# Patient Record
Sex: Female | Born: 1985 | Race: Black or African American | Hispanic: No | Marital: Single | Smoking: Never smoker
Health system: Southern US, Community
[De-identification: ages and names within clinical notes are randomized; demographics above are authoritative.]

## PROBLEM LIST (undated history)

## (undated) DIAGNOSIS — F419 Anxiety disorder, unspecified: Secondary | ICD-10-CM

---
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
## 2005-04-11 ENCOUNTER — Other Ambulatory Visit: Admission: RE | Admit: 2005-04-11 | Discharge: 2005-04-11 | Payer: Self-pay | Admitting: Gynecology

## 2005-05-21 ENCOUNTER — Emergency Department (HOSPITAL_COMMUNITY): Admission: EM | Admit: 2005-05-21 | Discharge: 2005-05-22 | Payer: Self-pay | Admitting: Emergency Medicine

## 2005-09-25 ENCOUNTER — Inpatient Hospital Stay (HOSPITAL_COMMUNITY): Admission: AD | Admit: 2005-09-25 | Discharge: 2005-09-25 | Payer: Self-pay | Admitting: Gynecology

## 2005-12-03 ENCOUNTER — Encounter (INDEPENDENT_AMBULATORY_CARE_PROVIDER_SITE_OTHER): Payer: Self-pay | Admitting: Specialist

## 2005-12-03 ENCOUNTER — Inpatient Hospital Stay (HOSPITAL_COMMUNITY): Admission: AD | Admit: 2005-12-03 | Discharge: 2005-12-05 | Payer: Self-pay | Admitting: Gynecology

## 2006-01-09 ENCOUNTER — Other Ambulatory Visit: Admission: RE | Admit: 2006-01-09 | Discharge: 2006-01-09 | Payer: Self-pay | Admitting: Gynecology

## 2008-04-10 ENCOUNTER — Encounter: Payer: Self-pay | Admitting: Women's Health

## 2008-04-10 ENCOUNTER — Ambulatory Visit: Payer: Self-pay | Admitting: Women's Health

## 2008-04-10 ENCOUNTER — Other Ambulatory Visit: Admission: RE | Admit: 2008-04-10 | Discharge: 2008-04-10 | Payer: Self-pay | Admitting: Gynecology

## 2008-04-10 ENCOUNTER — Ambulatory Visit: Payer: Self-pay | Admitting: Gynecology

## 2009-10-01 ENCOUNTER — Ambulatory Visit: Payer: Self-pay | Admitting: Diagnostic Radiology

## 2009-10-01 ENCOUNTER — Emergency Department (HOSPITAL_BASED_OUTPATIENT_CLINIC_OR_DEPARTMENT_OTHER): Admission: EM | Admit: 2009-10-01 | Discharge: 2009-10-01 | Payer: Self-pay | Admitting: Emergency Medicine

## 2010-04-08 LAB — URINALYSIS, ROUTINE W REFLEX MICROSCOPIC
Glucose, UA: NEGATIVE mg/dL
Ketones, ur: NEGATIVE mg/dL
Nitrite: NEGATIVE
Protein, ur: NEGATIVE mg/dL
Specific Gravity, Urine: 1.013 (ref 1.005–1.030)
pH: 6 (ref 5.0–8.0)

## 2010-04-08 LAB — URINE MICROSCOPIC-ADD ON

## 2010-08-30 ENCOUNTER — Encounter: Payer: Self-pay | Admitting: *Deleted

## 2010-08-30 ENCOUNTER — Other Ambulatory Visit: Payer: Self-pay

## 2010-08-30 ENCOUNTER — Emergency Department (HOSPITAL_BASED_OUTPATIENT_CLINIC_OR_DEPARTMENT_OTHER)
Admission: EM | Admit: 2010-08-30 | Discharge: 2010-08-31 | Disposition: A | Discharge status: Home / Self Care | Payer: Self-pay | Attending: Emergency Medicine | Admitting: Emergency Medicine

## 2010-08-30 ENCOUNTER — Emergency Department (INDEPENDENT_AMBULATORY_CARE_PROVIDER_SITE_OTHER): Payer: Self-pay

## 2010-08-30 DIAGNOSIS — R0602 Shortness of breath: Secondary | ICD-10-CM

## 2010-08-30 DIAGNOSIS — R079 Chest pain, unspecified: Secondary | ICD-10-CM | POA: Insufficient documentation

## 2010-08-30 DIAGNOSIS — R509 Fever, unspecified: Secondary | ICD-10-CM | POA: Insufficient documentation

## 2010-08-30 DIAGNOSIS — A599 Trichomoniasis, unspecified: Secondary | ICD-10-CM | POA: Insufficient documentation

## 2010-08-30 DIAGNOSIS — O99891 Other specified diseases and conditions complicating pregnancy: Secondary | ICD-10-CM

## 2010-08-30 DIAGNOSIS — J90 Pleural effusion, not elsewhere classified: Secondary | ICD-10-CM

## 2010-08-30 DIAGNOSIS — O98819 Other maternal infectious and parasitic diseases complicating pregnancy, unspecified trimester: Secondary | ICD-10-CM | POA: Insufficient documentation

## 2010-08-30 DIAGNOSIS — N390 Urinary tract infection, site not specified: Secondary | ICD-10-CM | POA: Insufficient documentation

## 2010-08-30 HISTORY — DX: Anxiety disorder, unspecified: F41.9

## 2010-08-30 LAB — CBC
Platelets: 202 10*3/uL (ref 150–400)
RBC: 3.95 MIL/uL (ref 3.87–5.11)
RDW: 14.8 % (ref 11.5–15.5)

## 2010-08-30 LAB — URINE MICROSCOPIC-ADD ON

## 2010-08-30 LAB — BASIC METABOLIC PANEL
BUN: 5 mg/dL — ABNORMAL LOW (ref 6–23)
CO2: 19 mEq/L (ref 19–32)
Calcium: 8.7 mg/dL (ref 8.4–10.5)
Creatinine, Ser: 0.6 mg/dL (ref 0.50–1.10)
GFR calc Af Amer: >60 mL/min (ref >60)
GFR calc non Af Amer: >60 mL/min (ref >60)
Glucose, Bld: 95 mg/dL (ref 70–99)
Sodium: 128 mEq/L — ABNORMAL LOW (ref 135–145)

## 2010-08-30 LAB — URINALYSIS, ROUTINE W REFLEX MICROSCOPIC
Glucose, UA: 100 mg/dL — AB
Ketones, ur: 40 mg/dL — AB
Nitrite: NEGATIVE
Specific Gravity, Urine: 1.015 (ref 1.005–1.030)
Urobilinogen, UA: 1 mg/dL (ref 0.0–1.0)
pH: 6.5 (ref 5.0–8.0)

## 2010-08-30 MED ORDER — IOHEXOL 350 MG/ML SOLN
80.0000 mL | Freq: Once | INTRAVENOUS | Status: AC | PRN
  Administered 2010-08-30: 80 mL via INTRAVENOUS

## 2010-08-30 MED ORDER — SODIUM CHLORIDE 0.9 % IV BOLUS (SEPSIS)
1000.0000 mL | Freq: Once | INTRAVENOUS | Status: AC
  Administered 2010-08-30: 1000 mL via INTRAVENOUS

## 2010-08-30 MED ORDER — ACETAMINOPHEN 500 MG PO TABS
1000.0000 mg | ORAL_TABLET | Freq: Once | ORAL | Status: AC
  Administered 2010-08-30: 1000 mg via ORAL
  Filled 2010-08-30: qty 2

## 2010-08-30 MED ORDER — POTASSIUM CHLORIDE CRYS ER 20 MEQ PO TBCR
EXTENDED_RELEASE_TABLET | ORAL | Status: AC
  Filled 2010-08-30: qty 3

## 2010-08-30 MED ORDER — MORPHINE SULFATE 2 MG/ML IJ SOLN
2.0000 mg | Freq: Once | INTRAMUSCULAR | Status: AC
  Administered 2010-08-30: 2 mg via INTRAVENOUS
  Filled 2010-08-30: qty 1

## 2010-08-30 MED ORDER — POTASSIUM CHLORIDE 20 MEQ PO PACK
60.0000 meq | PACK | Freq: Once | ORAL | Status: AC
  Administered 2010-08-30: 60 meq via ORAL
  Filled 2010-08-30: qty 3

## 2010-08-30 NOTE — ED Notes (Signed)
MD performed bedside ultrasound, pt tolerated well.

## 2010-08-30 NOTE — ED Notes (Signed)
Pt states she began having a cough and diff breathing on Friday which has worsened. Pt also started having a fever today and has SOB with exertion. Pt states that she is [redacted] weeks pregnant but has had no prenatal care. Pt is G3P2, with 2 healthy deliveries in the past. Denies any abd pain or vaginal bleeding.

## 2010-08-30 NOTE — ED Provider Notes (Addendum)
History     CSN: [id number] Arrival date & time: 08/30/2010  7:23 PM  Chief Complaint  Patient presents with  . Chest Pain   HPI Comments: 25yoF G3P2 [redacted] weeks pregnant ho anxiety pw cp/sob. Per patient sx began today. C/O substernal sharp pain, worse with exertion and with movement. +Fever to 101. +min non productive cough. Denies feelings of anxiety. Feels more SOB than usual. Denies abdominal pain/N/V. Denies hematuria/dysuria/frequency/urgency, no back pain. Denies vaginal discharge. Denies sick contacts. Has not received prenatal care 2/2 no insurance. Has been taking PNV. No h/o VTE in self or family. No h/o ca/hiv/iv drug abuse. Denies recent hosp/surg/travel/immobilization. Baseline leg swelling. States her b/l calf are crampy when she walks, this is not new She can feel the baby moving. There is no gush of fluid or vaginal bleeding.    PAST MEDICAL HISTORY:  Past Medical History  Diagnosis Date  . Anxiety     PAST SURGICAL HISTORY:  History reviewed. No pertinent past surgical history.  MEDICATIONS:  Previous Medications   CHOLECALCIFEROL (VITAMIN D3) 3000 UNITS TABS    Take 1 tablet by mouth daily.     CITALOPRAM (CELEXA) 20 MG TABLET    Take 20 mg by mouth daily.     PRENAT W/O A-FE-DSS-METHFOL-FA (PRENATAL MULTIVITAMIN) 90-600-400 MG-MCG-MCG TABLET    Take 1 tablet by mouth daily.       ALLERGIES:  Allergies as of 08/30/2010  . (No Known Allergies)     FAMILY HISTORY:  No family history on file.   SOCIAL HISTORY: History   Social History  . Marital Status: Single    Spouse Name: N/A    Number of Children: N/A  . Years of Education: N/A   Social History Main Topics  . Smoking status: Never Smoker   . Smokeless tobacco: None  . Alcohol Use: No  . Drug Use: No  . Sexually Active:    Other Topics Concern  . None   Social History Narrative  . None     Review of Systems  Constitutional: Negative.  Negative for fever and fatigue.  HENT: Negative.     Eyes: Negative.   Respiratory: Negative.  Negative for chest tightness and shortness of breath.   Cardiovascular: Negative.  Negative for chest pain and leg swelling.  Gastrointestinal: Negative.  Negative for nausea, vomiting, abdominal pain and blood in stool.  Genitourinary: Negative.   Musculoskeletal: Negative.  Negative for arthralgias.  Neurological: Negative.  Negative for dizziness and light-headedness.  Hematological: Negative.   Psychiatric/Behavioral: Negative.   All other systems reviewed and are negative.  except as noted HPI 10 Systems reviewed and are negative for acute change except as noted in the HPI.  Physical Exam  BP 94/57  Pulse 98  Temp(Src) 98.2 F (36.8 C) (Oral)  Resp 20  SpO2 99%  LMP 03/13/2010  Physical Exam  Nursing note and vitals reviewed. Constitutional: She is oriented to person, place, and time. She appears well-developed.  HENT:  Head: Atraumatic.  Mouth/Throat: Oropharynx is clear and moist.  Eyes: Conjunctivae and EOM are normal. Pupils are equal, round, and reactive to light.  Neck: Normal range of motion. Neck supple.  Cardiovascular: Regular rhythm, normal heart sounds and intact distal pulses.        tachycardic  Pulmonary/Chest: Effort normal. No respiratory distress. She has no wheezes. She has no rales. She exhibits no tenderness.       Bibasilar crackles  Abdominal: Soft. She exhibits distension. There is  no tenderness. There is no rebound and no guarding.       Gravid no ttp Bedside u/s fetus moving +FHR 146 No CVAT  Musculoskeletal: Normal range of motion. She exhibits no edema and no tenderness.  Neurological: She is alert and oriented to person, place, and time.  Skin: Skin is warm and dry. No rash noted.  Psychiatric: She has a normal mood and affect.    ED Course  Procedures  OTHER DATA REVIEWED: Nursing notes, vital signs, and past medical records reviewed.   DIAGNOSTIC STUDIES:    LABS /  RADIOLOGY: Results for orders placed during the hospital encounter of 08/30/10  CBC      Component Value Range   WBC 4.8  4.0 - 10.5 (K/uL)   RBC 3.95  3.87 - 5.11 (MIL/uL)   Hemoglobin 10.3 (*) 12.0 - 15.0 (g/dL)   HCT 45.4 (*) 09.8 - 46.0 (%)   MCV 78.0  78.0 - 100.0 (fL)   MCH 26.1  26.0 - 34.0 (pg)   MCHC 33.4  30.0 - 36.0 (g/dL)   RDW 11.9  14.7 - 82.9 (%)   Platelets 202  150 - 400 (K/uL)  BASIC METABOLIC PANEL      Component Value Range   Sodium 128 (*) 135 - 145 (mEq/L)   Potassium 2.7 (*) 3.5 - 5.1 (mEq/L)   Chloride 96  96 - 112 (mEq/L)   CO2 19  19 - 32 (mEq/L)   Glucose, Bld 95  70 - 99 (mg/dL)   BUN 5 (*) 6 - 23 (mg/dL)   Creatinine, Ser 5.62  0.50 - 1.10 (mg/dL)   Calcium 8.7  8.4 - 13.0 (mg/dL)   GFR calc non Af Amer >60  >60 (mL/min)   GFR calc Af Amer >60  >60 (mL/min)  URINALYSIS, ROUTINE W REFLEX MICROSCOPIC      Component Value Range   Color, Urine YELLOW  YELLOW    Appearance CLOUDY (*) CLEAR    Specific Gravity, Urine 1.015  1.005 - 1.030    pH 6.5  5.0 - 8.0    Glucose, UA 100 (*) NEGATIVE (mg/dL)   Hgb urine dipstick SMALL (*) NEGATIVE    Bilirubin Urine NEGATIVE  NEGATIVE    Ketones, ur 40 (*) NEGATIVE (mg/dL)   Protein, ur 865 (*) NEGATIVE (mg/dL)   Urobilinogen, UA 1.0  0.0 - 1.0 (mg/dL)   Nitrite NEGATIVE  NEGATIVE    Leukocytes, UA LARGE (*) NEGATIVE   URINE MICROSCOPIC-ADD ON      Component Value Range   Squamous Epithelial / LPF FEW (*) RARE    WBC, UA TOO NUMEROUS TO COUNT  <3 (WBC/hpf)   RBC / HPF 7-10  <3 (RBC/hpf)   Bacteria, UA MANY (*) RARE    Urine-Other MUCOUS PRESENT     Ct Angio Chest W/cm &/or Wo Cm  08/30/2010  *RADIOLOGY REPORT*  Clinical Data:  Chest pain, shortness of breath, [redacted] weeks pregnant, abdomen shielded  CT ANGIOGRAPHY CHEST WITH CONTRAST  Technique:  Multidetector CT imaging of the chest was performed using the standard protocol during bolus administration of intravenous contrast.  Multiplanar CT image  reconstructions including MIPs were obtained to evaluate the vascular anatomy.  Contrast:  80 ml Omnipaque-300 IV  Comparison:  Chest radiograph dated 10/01/2009  Findings:  Suboptimal contrast bolus with the majority of contrast in the aorta and right heart at the time of imaging.  No findings suspicious for pulmonary embolism.  Small bilateral pleural effusions.  Associated bilateral lower lobe opacities, likely atelectasis.  No suspicious pulmonary nodules. No pneumothorax.  Visualized thyroid is unremarkable.  Triangular soft tissue in the anterior mediastinum, likely reflecting residual thymus (series 4/image 22).  The heart is top normal in size.  No pericardial effusion.  No suspicious mediastinal, hilar, or axillary lymphadenopathy.  Visualized upper abdomen is unremarkable.  Review of the MIP images confirms the above findings.  IMPRESSION: Suboptimal contrast bolus.  No findings suspicious for pulmonary embolism.  Small bilateral pleural effusions.  Associated bilateral lower lobe opacities, likely atelectasis.  Original Report Authenticated By: Charline Bills, M.D.   PROCEDURES:   Date: 08/31/2010  Rate: 125  Rhythm: normal sinus rhythm  QRS Axis: normal  Intervals: normal  ST/T Wave abnormalities: nonspecific T wave changes  Conduction Disutrbances:none  Narrative Interpretation:   Old EKG Reviewed: changes noted    ED COURSE / COORDINATION OF CARE: 12:44 AM  Pt feeling better. No CP at this time. Tachycardia resolved now afebrile Will treat UTI and trich noted on urine although pt is asx. No pneumonia or PE on study. Will dc home with f//u   MDM: Differential Diagnosis: 1. CP- pna, PE, msk pain pericarditis myocarditis less likely   PLAN: Discharge The patient is to return the emergency department if there is any worsening of symptoms. I have reviewed the discharge instructions with the patient/family   CONDITION ON DISCHARGE: Stable, improved   MEDICATIONS GIVEN IN  THE E.D.  Medications  Prenat w/o A-FE-DSS-Methfol-FA (PRENATAL MULTIVITAMIN) 90-600-400 MG-MCG-MCG tablet (not administered)  citalopram (CELEXA) 20 MG tablet (not administered)  Cholecalciferol (VITAMIN D3) 3000 UNITS TABS (not administered)  nitrofurantoin, macrocrystal-monohydrate, (MACROBID) 100 MG capsule (not administered)  potassium chloride (K-DUR) 10 MEQ tablet (not administered)  potassium chloride (K-DUR) 10 MEQ tablet (not administered)  nitrofurantoin, macrocrystal-monohydrate, (MACROBID) 100 MG capsule (not administered)  sodium chloride 0.9 % bolus 1,000 mL (1000 mL Intravenous Given 08/30/10 2000)  acetaminophen (TYLENOL) tablet 1,000 mg (1000 mg Oral Given 08/30/10 2030)  iohexol (OMNIPAQUE) 350 MG/ML injection 80 mL (80 mL Intravenous Contrast Given 08/30/10 2139)  potassium chloride (KLOR-CON) packet 60 mEq (60 mEq Oral Given 08/30/10 2145)  morphine injection 2 mg (2 mg Intravenous Given 08/30/10 2230)  sodium chloride 0.9 % bolus 1,000 mL (1000 mL Intravenous Total Dose 08/31/10 0017)  potassium chloride SA (K-DUR,KLOR-CON) 20 MEQ CR tablet (0 mEq  Duplicate 08/30/10 2230)      Forbes Cellar, MD 08/31/10 4098  Forbes Cellar, MD 08/31/10 [phone]

## 2010-08-30 NOTE — ED Notes (Signed)
Pt ambulated to restroom without diff, NAD noted.

## 2010-08-30 NOTE — ED Notes (Signed)
Chest pain. Fever 101 at home today. Cough. 21 weeks preg.

## 2010-08-30 NOTE — ED Notes (Signed)
rec'd call from lab reporting critical K+ of 2.7. MD made aware.

## 2010-08-31 MED ORDER — METRONIDAZOLE 500 MG PO TABS: 500.0000 mg | ORAL_TABLET | Freq: Two times a day (BID) | ORAL | Status: DC

## 2010-08-31 MED ORDER — NITROFURANTOIN MONOHYD MACRO 100 MG PO CAPS: 100.0000 mg | ORAL_CAPSULE | Freq: Two times a day (BID) | ORAL | Status: DC

## 2010-08-31 MED ORDER — METRONIDAZOLE 500 MG PO TABS: 500.0000 mg | ORAL_TABLET | Freq: Two times a day (BID) | ORAL | Status: AC

## 2010-08-31 MED ORDER — POTASSIUM CHLORIDE ER 10 MEQ PO TBCR: 20.0000 meq | EXTENDED_RELEASE_TABLET | Freq: Two times a day (BID) | ORAL | Status: DC

## 2010-08-31 MED ORDER — NITROFURANTOIN MONOHYD MACRO 100 MG PO CAPS: 100.0000 mg | ORAL_CAPSULE | Freq: Two times a day (BID) | ORAL | Status: AC

## 2010-09-01 ENCOUNTER — Other Ambulatory Visit: Payer: Self-pay

## 2010-09-01 ENCOUNTER — Encounter (HOSPITAL_BASED_OUTPATIENT_CLINIC_OR_DEPARTMENT_OTHER): Payer: Self-pay | Admitting: *Deleted

## 2010-09-01 ENCOUNTER — Emergency Department (HOSPITAL_BASED_OUTPATIENT_CLINIC_OR_DEPARTMENT_OTHER)
Admission: EM | Admit: 2010-09-01 | Discharge: 2010-09-01 | Disposition: A | Discharge status: Home / Self Care | Payer: Self-pay | Attending: Emergency Medicine | Admitting: Emergency Medicine

## 2010-09-01 DIAGNOSIS — R079 Chest pain, unspecified: Secondary | ICD-10-CM | POA: Insufficient documentation

## 2010-09-01 LAB — BASIC METABOLIC PANEL
Calcium: 8.6 mg/dL (ref 8.4–10.5)
Chloride: 99 mEq/L (ref 96–112)
Creatinine, Ser: 0.5 mg/dL (ref 0.50–1.10)
Sodium: 132 mEq/L — ABNORMAL LOW (ref 135–145)

## 2010-09-01 LAB — CBC
HCT: 30.4 % — ABNORMAL LOW (ref 36.0–46.0)
MCH: 26 pg (ref 26.0–34.0)
MCHC: 33.2 g/dL (ref 30.0–36.0)
RDW: 14.9 % (ref 11.5–15.5)

## 2010-09-01 MED ORDER — HYDROCODONE-ACETAMINOPHEN 5-325 MG PO TABS: 1.0000 | ORAL_TABLET | ORAL | Status: AC | PRN

## 2010-09-01 MED ORDER — SODIUM CHLORIDE 0.9 % IV BOLUS (SEPSIS): 250.0000 mL | Freq: Once | INTRAVENOUS | Status: DC

## 2010-09-01 MED ORDER — SODIUM CHLORIDE 0.9 % IV SOLN: INTRAVENOUS | Status: DC

## 2010-09-01 NOTE — ED Notes (Signed)
Patient is resting comfortably.Denies any chest pain or SOB at this time

## 2010-09-01 NOTE — ED Notes (Signed)
Patient is resting comfortably. 

## 2010-09-01 NOTE — ED Notes (Signed)
Vital signs stable. 

## 2010-09-01 NOTE — ED Notes (Signed)
Pt lying in bed CAO watching TV. Pt is in NAD with resp even and non-labored. Pt denies pain now but sts some earlier when rolling onto her side. Pt showing NSR on monitor with rate of 79. IV site unremarkable with NS infusing.

## 2010-09-01 NOTE — ED Notes (Signed)
Pt states seen here Friday for same, return today with c/o palpitations and cont CP.

## 2010-11-30 NOTE — ED Provider Notes (Signed)
History     CSN: [id number] Arrival date & time: 09/01/2010  5:02 PM   None     Chief Complaint  Patient presents with  . Chest Pain    (Consider location/radiation/quality/duration/timing/severity/associated sxs/prior treatment) Patient is a 25 y.o. female presenting with chest pain. The history is provided by the patient.  Chest Pain The chest pain began 3 - 5 days ago. Chest pain occurs constantly (NOW BUT WAXING AND WANEING. ). The chest pain is worsening. The pain is associated with breathing and exertion. At its most intense, the pain is at 10/10. The pain is currently at 10/10. The quality of the pain is described as sharp. The pain does not radiate. Chest pain is worsened by exertion and deep breathing. Primary symptoms include shortness of breath and palpitations. Pertinent negatives for primary symptoms include no fever, no syncope, no cough, no wheezing, no abdominal pain, no nausea, no vomiting, no dizziness and no altered mental status.  The palpitations also occurred with shortness of breath. The palpitations did not occur with dizziness.   Pertinent negatives for associated symptoms include no lower extremity edema, no near-syncope and no weakness.   SEEN 2 DAY AGO FOR SAME SOB AND CP AND HAD EXTENSIVE WORK UP THAT IS NEGATIVE. PATIENT IS 21 WEEKS PREGANT. ALSO SX'S ASSOCIATED WITH FAST HEART RATE PALPITATIONS.   Past Medical History  Diagnosis Date  . Anxiety     History reviewed. No pertinent past surgical history.  History reviewed. No pertinent family history.  History  Substance Use Topics  . Smoking status: Never Smoker   . Smokeless tobacco: Not on file  . Alcohol Use: No    OB History    Grav Para Term Preterm Abortions TAB SAB Ect Mult Living   1               Review of Systems  Constitutional: Negative for fever.  Eyes: Negative for photophobia and visual disturbance.  Respiratory: Positive for shortness of breath. Negative for cough and  wheezing.   Cardiovascular: Positive for chest pain and palpitations. Negative for syncope and near-syncope.  Gastrointestinal: Negative for nausea, vomiting and abdominal pain.  Genitourinary: Negative for dysuria, hematuria, vaginal bleeding and vaginal discharge.  Musculoskeletal: Negative for back pain.  Skin: Negative for rash.  Neurological: Negative for dizziness, weakness and headaches.  Hematological: Does not bruise/bleed easily.  Psychiatric/Behavioral: Negative for confusion and altered mental status.    Allergies  Review of patient's allergies indicates no known allergies.  Home Medications   Current Outpatient Rx  Name Route Sig Dispense Refill  . VITAMIN D3 3000 UNITS PO TABS Oral Take 1 tablet by mouth daily.      Marland Kitchen CITALOPRAM HYDROBROMIDE 20 MG PO TABS Oral Take 20 mg by mouth daily.      Marland Kitchen POTASSIUM CHLORIDE CR 10 MEQ PO TBCR Oral Take 2 tablets (20 mEq total) by mouth 2 (two) times daily. 4 tablet 0  . PRENATE ELITE 90-600-400 MG-MCG-MCG PO TABS Oral Take 1 tablet by mouth daily.        BP 107/60  Pulse 98  Temp(Src) 98.3 F (36.8 C) (Oral)  Resp 18  Wt 170 lb (77.111 kg)  SpO2 100%  LMP 03/13/2010  Physical Exam  Nursing note and vitals reviewed. Constitutional: She is oriented to person, place, and time. She appears well-developed and well-nourished.  HENT:  Head: Normocephalic and atraumatic.  Mouth/Throat: Oropharynx is clear and moist.  Eyes: Conjunctivae are normal. Pupils are equal,  round, and reactive to light.  Neck: Normal range of motion. Neck supple.  Cardiovascular: Regular rhythm, normal heart sounds and intact distal pulses.   No murmur heard.      TACHY  Pulmonary/Chest: Effort normal and breath sounds normal. No respiratory distress. She has no wheezes. She has no rales. She exhibits no tenderness.  Abdominal: Soft. Bowel sounds are normal. There is no tenderness.  Musculoskeletal: Normal range of motion. She exhibits no edema and no  tenderness.  Neurological: She is alert and oriented to person, place, and time. No cranial nerve deficit. She exhibits normal muscle tone. Coordination normal.  Skin: Skin is warm. No rash noted. She is not diaphoretic. No erythema.    ED Course  Procedures (including critical care time)  Labs Reviewed  CBC - Abnormal; Notable for the following:    Hemoglobin 10.1 (*)    HCT 30.4 (*)    All other components within normal limits  BASIC METABOLIC PANEL - Abnormal; Notable for the following:    Sodium 132 (*)    Potassium 3.2 (*)    CO2 17 (*)    BUN 3 (*)    All other components within normal limits   No results found.   1. Chest pain, unspecified       MDM   PATIENT SEEN 8/6 FOR SAME IS [redacted] WEEKS PREGNANT. AT THAT VISIT HAD NEGATIVE EKG AND CT ANGIO FOR PE EX FOR NOTED BILAT PLUERAL EFFUSIONS SMALL. TODAYS LAB WORK UP WITHOUT SIG FINDINGS. VISIT FROM 8/6 NOTE REVIEWED ALONG WITH LABS AND XRAYS.   TODAY GIVEN IV FLUIDS, CARDIAC MONITORING AND PULSE OXIMETRY. LABS TODAY WITH OUT SIG FINDINGS. AT DC HEART RATE 98 AND NL BP AND OYGEN SATS 100% ON RA. SUSPECT NONSPECIFIC CHEST PAIN DOUBT PE, NO CARDIAC ARRYTHMIAS.        Shelda Jakes, MD 11/30/10 1500

## 2012-11-22 IMAGING — CT CT ANGIO CHEST
2 of 7 series · 19 of 36 positions shown · IV contrast (APPLIED)
Comparison: Chest radiograph dated 10/01/2009

CLINICAL DATA: Chest pain, shortness of breath, 21 weeks pregnant,
abdomen shielded

CT ANGIOGRAPHY CHEST WITH CONTRAST
TECHNIQUE: Multidetector CT imaging of the chest was performed
using the standard protocol during bolus administration of
intravenous contrast.  Multiplanar CT image reconstructions
including MIPs were obtained to evaluate the vascular anatomy.
Contrast:  80 ml Lmnipaque-IBB IV

[Series 5: pe 1.0 b25f · axial · 0.59mm/px · z∈[-34,+134]mm · 18 of 188 slices shown]
[im 10/188  lung]
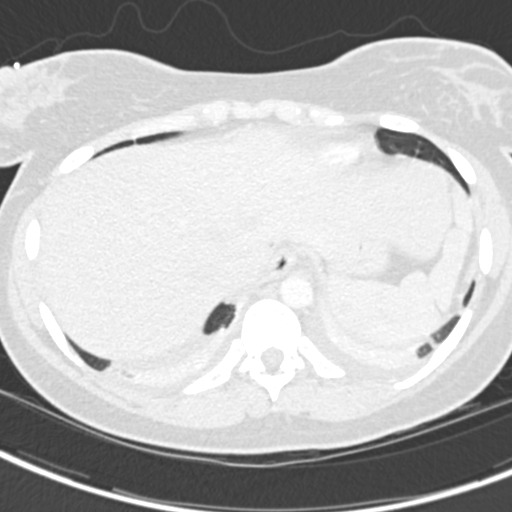
[im 19/188  mediastinal]
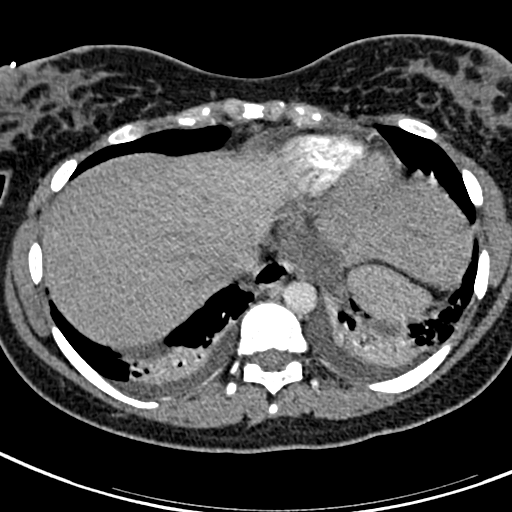
[im 29/188  lung]
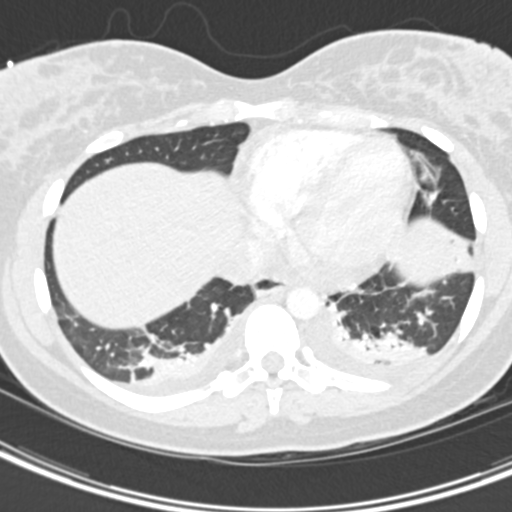
[im 38/188  mediastinal]
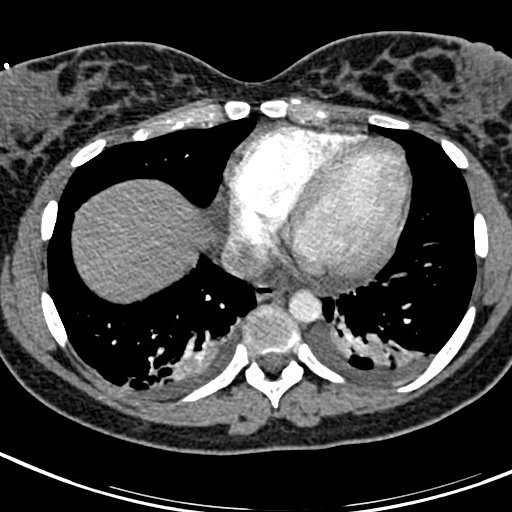
[im 47/188  lung]
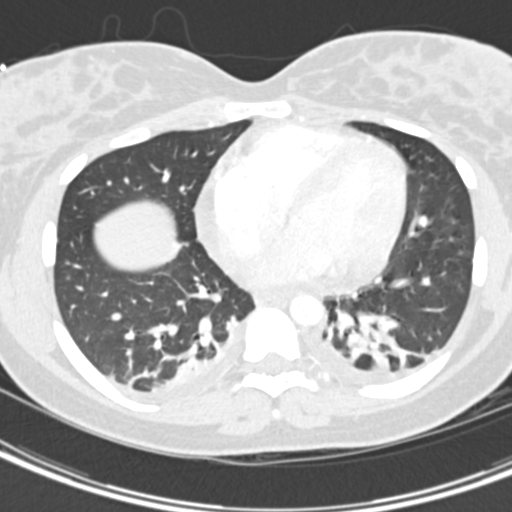
[im 57/188  mediastinal]
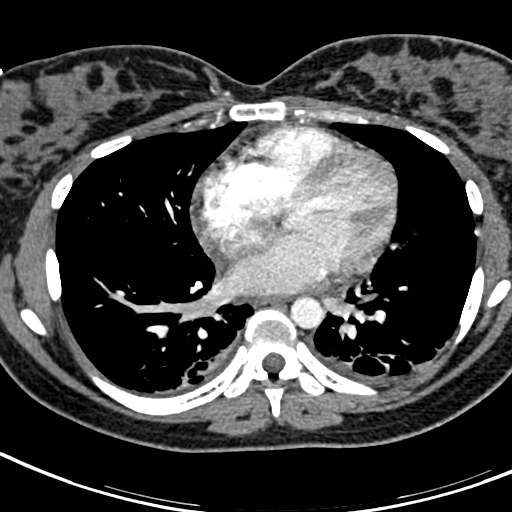
[im 66/188  lung]
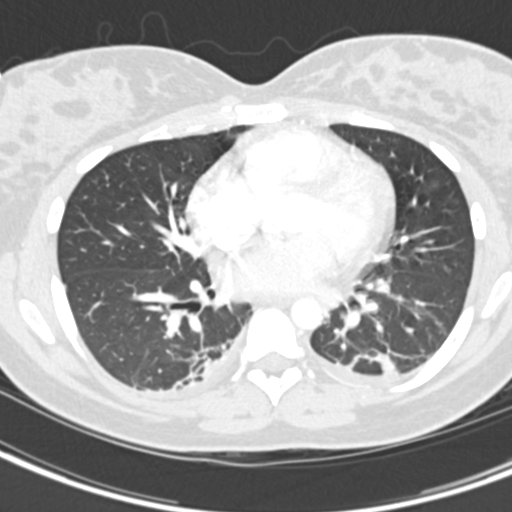
[im 75/188  mediastinal]
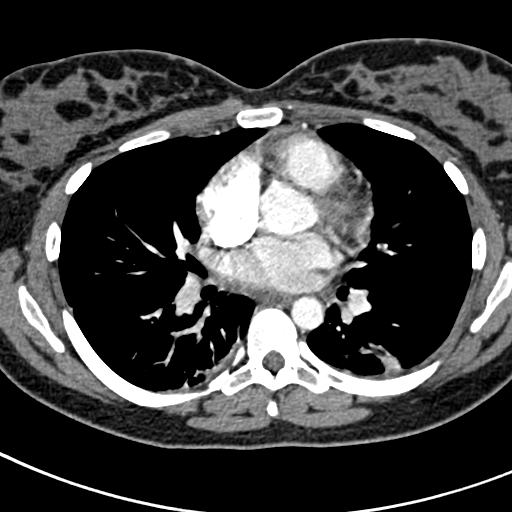
[im 85/188  lung]
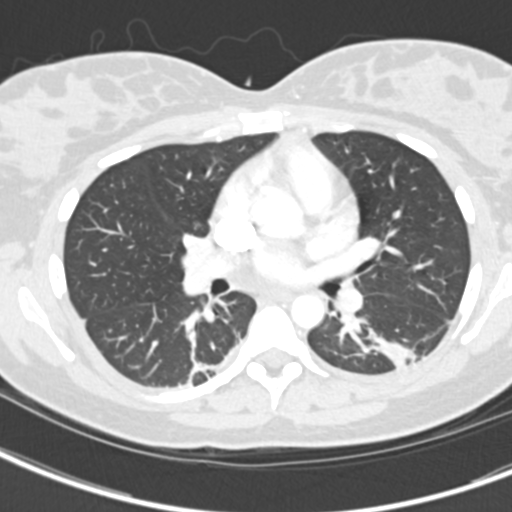
[im 103/188  mediastinal]
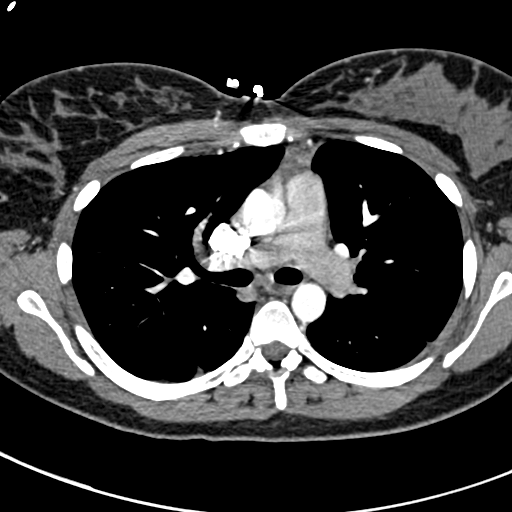
[im 113/188  lung]
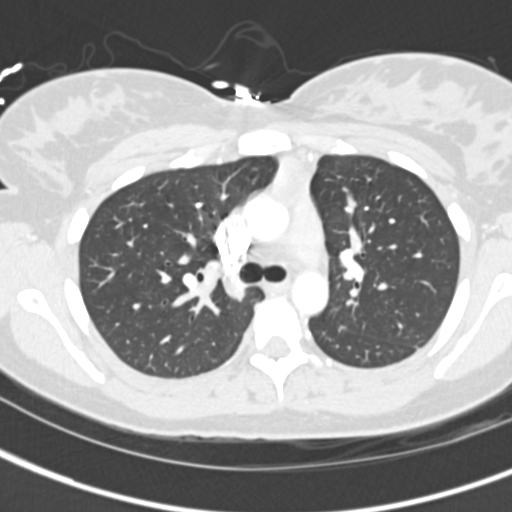
[im 122/188  mediastinal]
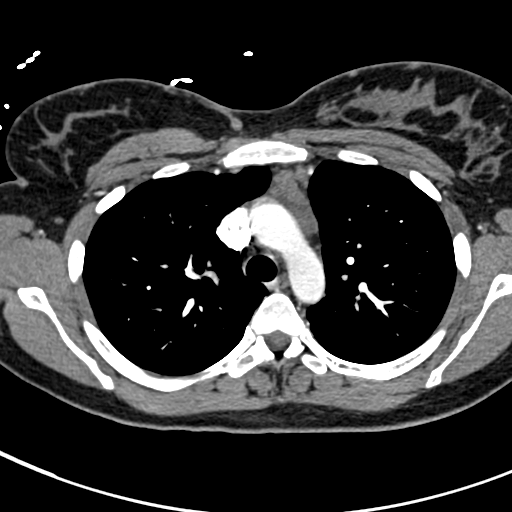
[im 131/188  lung]
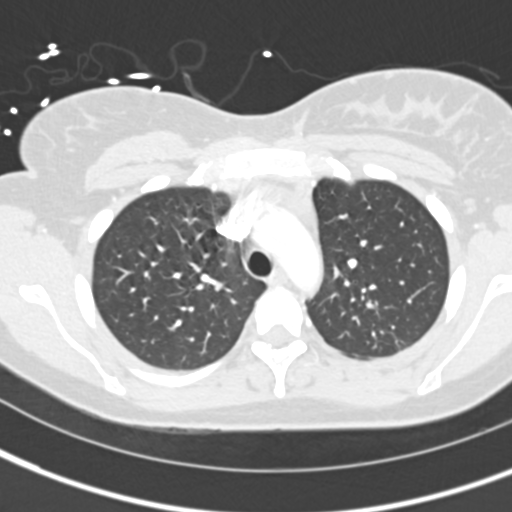
[im 141/188  mediastinal]
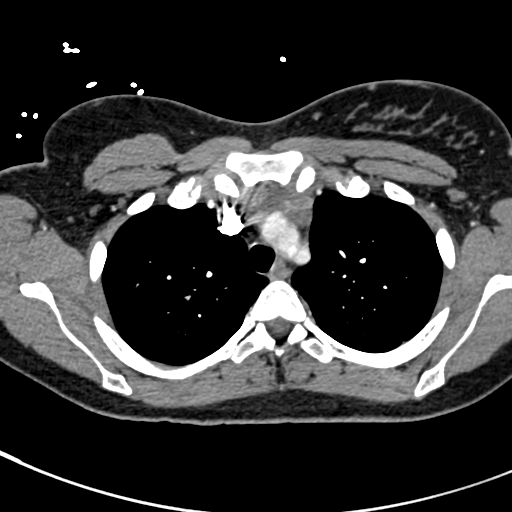
[im 150/188  lung]
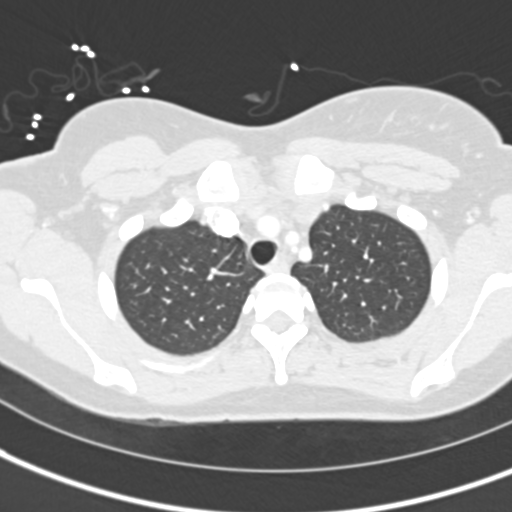
[im 159/188  mediastinal]
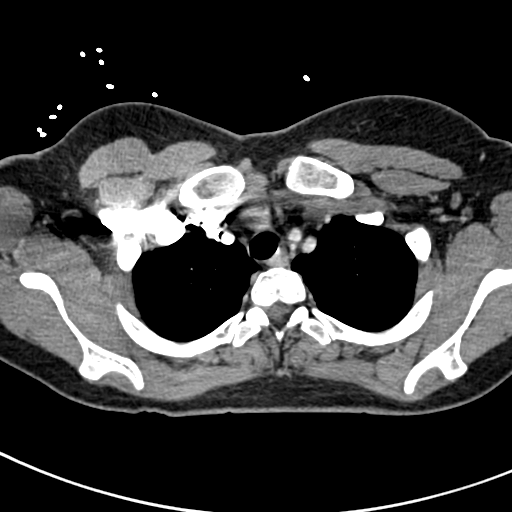
[im 169/188  lung]
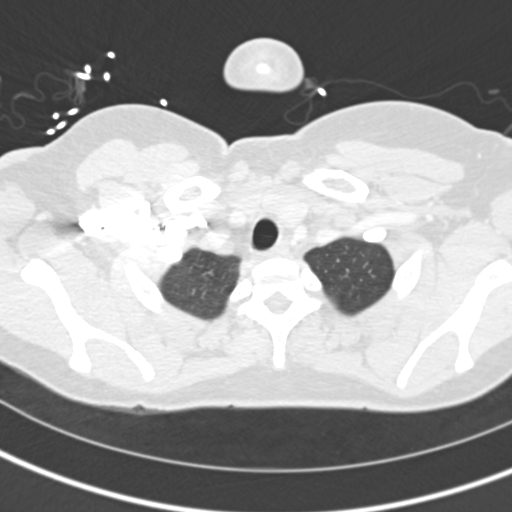
[im 178/188  mediastinal]
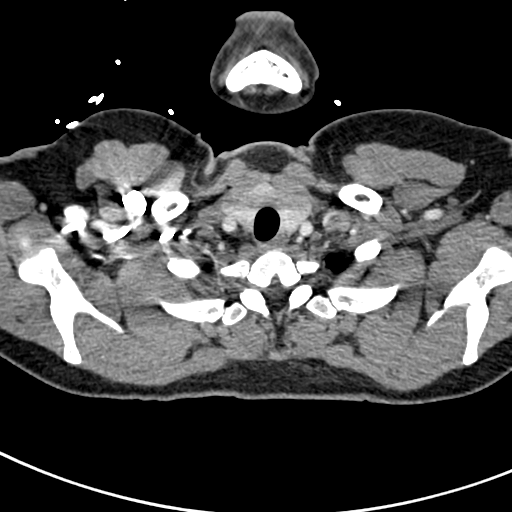

[Series 8: pe 3.0 b25f · coronal · 0.44mm/px · 1 of 121 slices shown]
[im 61/121  mediastinal]
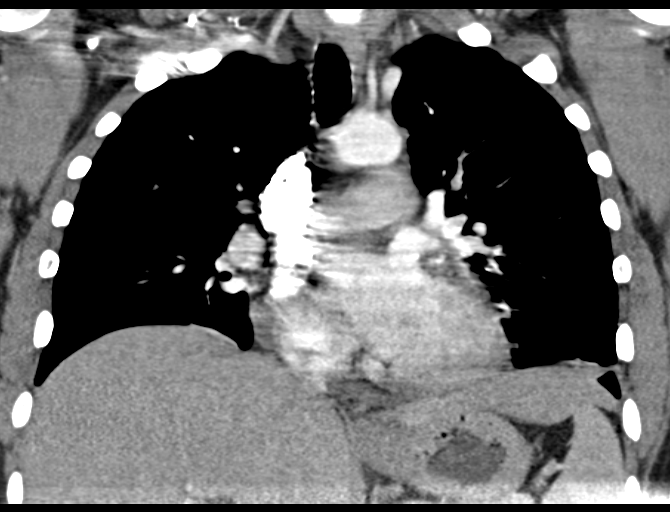

[19 of 36 positions shown; findings below may reference images not displayed]

FINDINGS: Suboptimal contrast bolus with the majority of contrast
in the aorta and right heart at the time of imaging.

No findings suspicious for pulmonary embolism.

Small bilateral pleural effusions.  Associated bilateral lower lobe
opacities, likely atelectasis.  No suspicious pulmonary nodules.
No pneumothorax.

Visualized thyroid is unremarkable.

Triangular soft tissue in the anterior mediastinum, likely
reflecting residual thymus (series 4/image 22).

The heart is top normal in size.  No pericardial effusion.

No suspicious mediastinal, hilar, or axillary lymphadenopathy.

Visualized upper abdomen is unremarkable.

Review of the MIP images confirms the above findings.
IMPRESSION: Suboptimal contrast bolus.  No findings suspicious for pulmonary
embolism.

Small bilateral pleural effusions.  Associated bilateral lower lobe
opacities, likely atelectasis.

## 2013-11-25 ENCOUNTER — Encounter (HOSPITAL_BASED_OUTPATIENT_CLINIC_OR_DEPARTMENT_OTHER): Payer: Self-pay | Admitting: *Deleted

## 2015-12-08 ENCOUNTER — Other Ambulatory Visit (HOSPITAL_COMMUNITY): Payer: Self-pay | Admitting: Ophthalmology

## 2015-12-08 DIAGNOSIS — H4710 Unspecified papilledema: Secondary | ICD-10-CM

## 2015-12-16 ENCOUNTER — Ambulatory Visit (HOSPITAL_COMMUNITY): Payer: Managed Care, Other (non HMO)

## 2020-01-25 NOTE — L&D Delivery Note (Addendum)
Delivery Note Arrived to room to evaluate for cervical change. Patient was found to be anterior lip and 0 station. Practice push in the bed performed and patient was then found to be complete and +1 station. Bed was broken down and patient draped. Fetal head was delivered over intact perineum. No nuchal was present. Shoulders and body followed without difficulty. Infant was placed on mother's chest, mouth and nose were bulb suctioned and let out a vigorous cry. Delayed cord clamping was performed for 60 seconds. Venous cord blood was collected. Placenta delivered spontaneously. Cervix, vagina, perineum and labia were inspected and a first degree vaginal laceration was found. The vaginal laceration was repaired using 3-0 Vicryl in standard fashion. Hemostasis noted. Uterus fundus firm. Cytotec was given per rectum and 1g TXA IV was administered as prophylaxis. Placenta was inspected, found to be intact with a 3 vessel cord. Counts were correct.    At 1:03 PM a viable and healthy female was delivered via Vaginal, Spontaneous (Presentation: Right Occiput Anterior).  APGAR: 9,9 ; weight  .   Placenta status: Spontaneous, Intact.  Cord: 3 vessels with the following complications: None.  Cord pH: Not collected  Anesthesia: None Episiotomy: None Lacerations: 1st degree;Vaginal Suture Repair: 3.0 vicryl Est. Blood Loss (mL):    Mom to postpartum.  Baby to Couplet care / Skin to Skin.  Steva Ready 10/09/2020, 1:15 PM

## 2020-04-28 LAB — OB RESULTS CONSOLE RUBELLA ANTIBODY, IGM: Rubella: IMMUNE

## 2020-04-28 LAB — OB RESULTS CONSOLE HIV ANTIBODY (ROUTINE TESTING): HIV: NONREACTIVE

## 2020-04-28 LAB — OB RESULTS CONSOLE HEPATITIS B SURFACE ANTIGEN: Hepatitis B Surface Ag: NEGATIVE

## 2020-04-30 LAB — OB RESULTS CONSOLE GC/CHLAMYDIA
Chlamydia: NEGATIVE
Gonorrhea: NEGATIVE

## 2020-09-14 LAB — OB RESULTS CONSOLE GBS: GBS: NEGATIVE

## 2020-10-09 ENCOUNTER — Encounter (HOSPITAL_COMMUNITY): Payer: Self-pay | Admitting: Obstetrics and Gynecology

## 2020-10-09 ENCOUNTER — Inpatient Hospital Stay (HOSPITAL_COMMUNITY): Payer: Managed Care, Other (non HMO) | Admitting: Anesthesiology

## 2020-10-09 ENCOUNTER — Inpatient Hospital Stay (HOSPITAL_COMMUNITY)
Admission: AD | Admit: 2020-10-09 | Discharge: 2020-10-11 | DRG: 807 | Disposition: A | Discharge status: Home / Self Care | Payer: Managed Care, Other (non HMO) | Attending: Obstetrics and Gynecology | Admitting: Obstetrics and Gynecology

## 2020-10-09 ENCOUNTER — Other Ambulatory Visit: Payer: Self-pay

## 2020-10-09 DIAGNOSIS — D509 Iron deficiency anemia, unspecified: Secondary | ICD-10-CM | POA: Diagnosis present

## 2020-10-09 DIAGNOSIS — F419 Anxiety disorder, unspecified: Secondary | ICD-10-CM | POA: Diagnosis present

## 2020-10-09 DIAGNOSIS — O99344 Other mental disorders complicating childbirth: Secondary | ICD-10-CM | POA: Diagnosis present

## 2020-10-09 DIAGNOSIS — Z20822 Contact with and (suspected) exposure to covid-19: Secondary | ICD-10-CM | POA: Diagnosis present

## 2020-10-09 DIAGNOSIS — O471 False labor at or after 37 completed weeks of gestation: Secondary | ICD-10-CM | POA: Diagnosis not present

## 2020-10-09 DIAGNOSIS — O26893 Other specified pregnancy related conditions, third trimester: Secondary | ICD-10-CM | POA: Diagnosis present

## 2020-10-09 DIAGNOSIS — Z3A39 39 weeks gestation of pregnancy: Secondary | ICD-10-CM | POA: Diagnosis not present

## 2020-10-09 DIAGNOSIS — O9902 Anemia complicating childbirth: Secondary | ICD-10-CM | POA: Diagnosis present

## 2020-10-09 LAB — RESP PANEL BY RT-PCR (FLU A&B, COVID) ARPGX2
Influenza A by PCR: NEGATIVE
Influenza B by PCR: NEGATIVE
SARS Coronavirus 2 by RT PCR: NEGATIVE

## 2020-10-09 LAB — CBC
HCT: 28.8 % — ABNORMAL LOW (ref 36.0–46.0)
Hemoglobin: 8.2 g/dL — ABNORMAL LOW (ref 12.0–15.0)
MCH: 19.7 pg — ABNORMAL LOW (ref 26.0–34.0)
MCHC: 28.5 g/dL — ABNORMAL LOW (ref 30.0–36.0)
MCV: 69.2 fL — ABNORMAL LOW (ref 80.0–100.0)
Platelets: 363 10*3/uL (ref 150–400)
RBC: 4.16 MIL/uL (ref 3.87–5.11)
RDW: 17.7 % — ABNORMAL HIGH (ref 11.5–15.5)
WBC: 11.7 10*3/uL — ABNORMAL HIGH (ref 4.0–10.5)
nRBC: 0.3 % — ABNORMAL HIGH (ref 0.0–0.2)

## 2020-10-09 LAB — TYPE AND SCREEN
ABO/RH(D): O POS
Antibody Screen: NEGATIVE

## 2020-10-09 LAB — RPR: RPR Ser Ql: NONREACTIVE

## 2020-10-09 MED ORDER — DIBUCAINE (PERIANAL) 1 % EX OINT: 1.0000 "application " | TOPICAL_OINTMENT | CUTANEOUS | Status: DC | PRN

## 2020-10-09 MED ORDER — WITCH HAZEL-GLYCERIN EX PADS: 1.0000 "application " | MEDICATED_PAD | CUTANEOUS | Status: DC | PRN

## 2020-10-09 MED ORDER — LACTATED RINGERS IV SOLN: 500.0000 mL | Freq: Once | INTRAVENOUS | Status: DC

## 2020-10-09 MED ORDER — PHENYLEPHRINE 40 MCG/ML (10ML) SYRINGE FOR IV PUSH (FOR BLOOD PRESSURE SUPPORT): 80.0000 ug | PREFILLED_SYRINGE | INTRAVENOUS | Status: DC | PRN

## 2020-10-09 MED ORDER — ACETAMINOPHEN 325 MG PO TABS: 650.0000 mg | ORAL_TABLET | ORAL | Status: DC | PRN

## 2020-10-09 MED ORDER — DIPHENHYDRAMINE HCL 25 MG PO CAPS: 25.0000 mg | ORAL_CAPSULE | Freq: Four times a day (QID) | ORAL | Status: DC | PRN

## 2020-10-09 MED ORDER — OXYTOCIN BOLUS FROM INFUSION
333.0000 mL | Freq: Once | INTRAVENOUS | Status: AC
  Administered 2020-10-09: 333 mL via INTRAVENOUS

## 2020-10-09 MED ORDER — EPHEDRINE 5 MG/ML INJ: 10.0000 mg | INTRAVENOUS | Status: DC | PRN

## 2020-10-09 MED ORDER — LACTATED RINGERS IV SOLN
500.0000 mL | INTRAVENOUS | Status: DC | PRN
  Administered 2020-10-09: 1000 mL via INTRAVENOUS

## 2020-10-09 MED ORDER — OXYCODONE-ACETAMINOPHEN 5-325 MG PO TABS: 2.0000 | ORAL_TABLET | ORAL | Status: DC | PRN

## 2020-10-09 MED ORDER — MISOPROSTOL 200 MCG PO TABS
1000.0000 ug | ORAL_TABLET | Freq: Once | ORAL | Status: AC
  Administered 2020-10-09: 1000 ug via RECTAL
  Filled 2020-10-09: qty 5

## 2020-10-09 MED ORDER — PRENATAL MULTIVITAMIN CH
1.0000 | ORAL_TABLET | Freq: Every day | ORAL | Status: DC
  Administered 2020-10-10 – 2020-10-11 (×2): 1 via ORAL
  Filled 2020-10-09 (×2): qty 1

## 2020-10-09 MED ORDER — COCONUT OIL OIL: 1.0000 "application " | TOPICAL_OIL | Status: DC | PRN

## 2020-10-09 MED ORDER — ZOLPIDEM TARTRATE 5 MG PO TABS: 5.0000 mg | ORAL_TABLET | Freq: Every evening | ORAL | Status: DC | PRN

## 2020-10-09 MED ORDER — DIPHENHYDRAMINE HCL 50 MG/ML IJ SOLN: 12.5000 mg | INTRAMUSCULAR | Status: DC | PRN

## 2020-10-09 MED ORDER — TRANEXAMIC ACID-NACL 1000-0.7 MG/100ML-% IV SOLN
1000.0000 mg | Freq: Once | INTRAVENOUS | Status: AC
  Administered 2020-10-09: 1000 mg via INTRAVENOUS
  Filled 2020-10-09: qty 100

## 2020-10-09 MED ORDER — SENNOSIDES-DOCUSATE SODIUM 8.6-50 MG PO TABS
2.0000 | ORAL_TABLET | Freq: Every day | ORAL | Status: DC
  Administered 2020-10-10 – 2020-10-11 (×2): 2 via ORAL
  Filled 2020-10-09 (×2): qty 2

## 2020-10-09 MED ORDER — SOD CITRATE-CITRIC ACID 500-334 MG/5ML PO SOLN: 30.0000 mL | ORAL | Status: DC | PRN

## 2020-10-09 MED ORDER — FLEET ENEMA 7-19 GM/118ML RE ENEM: 1.0000 | ENEMA | RECTAL | Status: DC | PRN

## 2020-10-09 MED ORDER — ONDANSETRON HCL 4 MG PO TABS: 4.0000 mg | ORAL_TABLET | ORAL | Status: DC | PRN

## 2020-10-09 MED ORDER — SIMETHICONE 80 MG PO CHEW: 80.0000 mg | CHEWABLE_TABLET | ORAL | Status: DC | PRN

## 2020-10-09 MED ORDER — ACETAMINOPHEN 325 MG PO TABS
650.0000 mg | ORAL_TABLET | ORAL | Status: DC | PRN
  Administered 2020-10-11: 650 mg via ORAL
  Filled 2020-10-09: qty 2

## 2020-10-09 MED ORDER — ONDANSETRON HCL 4 MG/2ML IJ SOLN: 4.0000 mg | INTRAMUSCULAR | Status: DC | PRN

## 2020-10-09 MED ORDER — LIDOCAINE HCL (PF) 1 % IJ SOLN: 30.0000 mL | INTRAMUSCULAR | Status: DC | PRN

## 2020-10-09 MED ORDER — BENZOCAINE-MENTHOL 20-0.5 % EX AERO: 1.0000 "application " | INHALATION_SPRAY | CUTANEOUS | Status: DC | PRN

## 2020-10-09 MED ORDER — ONDANSETRON HCL 4 MG/2ML IJ SOLN: 4.0000 mg | Freq: Four times a day (QID) | INTRAMUSCULAR | Status: DC | PRN

## 2020-10-09 MED ORDER — IBUPROFEN 600 MG PO TABS
600.0000 mg | ORAL_TABLET | Freq: Four times a day (QID) | ORAL | Status: DC
  Administered 2020-10-09 – 2020-10-11 (×8): 600 mg via ORAL
  Filled 2020-10-09 (×8): qty 1

## 2020-10-09 MED ORDER — LACTATED RINGERS IV SOLN: INTRAVENOUS | Status: DC

## 2020-10-09 MED ORDER — OXYCODONE-ACETAMINOPHEN 5-325 MG PO TABS: 1.0000 | ORAL_TABLET | ORAL | Status: DC | PRN

## 2020-10-09 MED ORDER — FENTANYL-BUPIVACAINE-NACL 0.5-0.125-0.9 MG/250ML-% EP SOLN
12.0000 mL/h | EPIDURAL | Status: DC | PRN
  Administered 2020-10-09: 12 mL/h via EPIDURAL
  Filled 2020-10-09: qty 250

## 2020-10-09 MED ORDER — LIDOCAINE-EPINEPHRINE (PF) 2 %-1:200000 IJ SOLN
INTRAMUSCULAR | Status: DC | PRN
  Administered 2020-10-09: 5 mL via EPIDURAL

## 2020-10-09 MED ORDER — OXYTOCIN-SODIUM CHLORIDE 30-0.9 UT/500ML-% IV SOLN
2.5000 [IU]/h | INTRAVENOUS | Status: DC
  Filled 2020-10-09: qty 500

## 2020-10-09 NOTE — H&P (Signed)
OB ADMISSION/ HISTORY & PHYSICAL:  Admission Date: 10/09/2020  1:54 AM  Admit Diagnosis: Normal labor  AUNNA SNOOKS is a 35 y.o. female [phone] [redacted]w[redacted]d presenting for contractions. Endorses active FM, denies LOF and vaginal bleeding. Ctx began @ 1700  History of current pregnancy: [id number]   Primary OB Provider: Deboraha Sprang Patient entered care with [facility] at 15.5 wks.   EDC 10/10/20 by LMP12/11/22 Posterior placenta Last evaluation: 39.4  wks  Significant prenatal events:  Anemia of pregnancy Hyperthyroidism Vit D Deficiency Anxiety AMA  Patient Active Problem List   Diagnosis Date Noted   Normal labor 10/09/2020    Prenatal Labs: ABO, Rh: --/--/O POS (09/16 0425) Antibody: NEG (09/16 0425) Rubella: Immune (04/05 0000)  RPR:   Negative HBsAg: Negative (04/05 0000)  HIV: Non-reactive (04/05 0000)  GTT: 87 GBS: Negative/-- (08/22 0000)  GC/CHL: Negative Genetics:  Tdap/influenza vaccines:    OB History  Gravida Para Term Preterm AB Living  4 3 3     3   SAB IAB Ectopic Multiple Live Births          3    # Outcome Date GA Lbr Len/2nd Weight Sex Delivery Anes PTL Lv  4 Current           3 Term 2017     Vag-Spont     2 Term 2012     Vag-Spont     1 Term 2005     Vag-Spont       Medical / Surgical History: Past medical history:  Past Medical History:  Diagnosis Date   Anxiety     Past surgical history: History reviewed. No pertinent surgical history. Family History: History reviewed. No pertinent family history.  Social History:  reports that she has never smoked. She has never used smokeless tobacco. She reports that she does not drink alcohol and does not use drugs.  Allergies: Patient has no known allergies.   Current Medications at time of admission:  Prior to Admission medications   Medication Sig Start Date End Date Taking? Authorizing Provider  acetaminophen (TYLENOL) 325 MG tablet Take 650 mg by mouth every 6 (six) hours as needed.   Yes  [provider]  Ipratropium-Albuterol (ALBUTEROL-IPRATROPIUM IN) Inhale into the lungs.   Yes [provider]  Prenat w/o A-FE-DSS-Methfol-FA (PRENATAL MULTIVITAMIN) 90-600-400 MG-MCG-MCG tablet Take 1 tablet by mouth daily.     Yes [provider]  Cholecalciferol (VITAMIN D3) 3000 UNITS TABS Take 1 tablet by mouth daily.      [provider]  citalopram (CELEXA) 20 MG tablet Take 20 mg by mouth daily.      [provider]  potassium chloride (K-DUR) 10 MEQ tablet Take 2 tablets (20 mEq total) by mouth 2 (two) times daily. 08/31/10 08/31/11  10/31/11, MD    Review of Systems: Constitutional: Negative   HENT: Negative   Eyes: Negative   Respiratory: Negative   Cardiovascular: Negative   Gastrointestinal: Negative  Genitourinary: positive for bloody show, negative for LOF   Musculoskeletal: Negative   Skin: Negative   Neurological: Negative   Endo/Heme/Allergies: Negative   Psychiatric/Behavioral: Negative    Physical Exam: VS: Blood pressure 127/77, pulse (!) 104, temperature 98.4 F (36.9 C), temperature source Oral, resp. rate 18, height 5' 6.5" (1.689 m), weight 90.4 kg, last menstrual period 01/04/2020, SpO2 100 %, unknown if currently breastfeeding. AAO x3, no signs of distress Cardiovascular: RRR Respiratory: Lung fields clear to ausculation GU/GI: Abdomen gravid, non-tender, non-distended, active  FM, vertex Extremities: negative edema, negative for pain, tenderness, and cords  Cervical exam:Dilation: 4 Effacement (%): 80 Station: -3 Exam by:: Santiago Bur, RN FHR: baseline rate 140 / variability moderate / accelerations present / occasional variable decelerations TOCO: 5   Prenatal Transfer Tool  Maternal Diabetes: No Genetic Screening: Normal Maternal Ultrasounds/Referrals: Declined Fetal Ultrasounds or other Referrals:  None Maternal Substance Abuse:  No Significant Maternal Medications:  None Significant  Maternal Lab Results: Group B Strep negative    Assessment: 35 y.o. [id number] [redacted]w[redacted]d admitted for normal labor  latent stage of labor FHR category 1 GBS negative Pain management plan: epidural   Plan:  Admit to L&D Routine admission orders Epidural PRN   Will notify Dr Connye Burkitt of admission and plan of care  Carollee Leitz MSN, CNM 10/09/2020 6:06 AM

## 2020-10-09 NOTE — Anesthesia Procedure Notes (Signed)
Epidural Patient location during procedure: OB Start time: 10/09/2020 5:45 AM End time: 10/09/2020 5:55 AM  Staffing Anesthesiologist: Elmer Picker, MD Performed: anesthesiologist   Preanesthetic Checklist Completed: patient identified, IV checked, risks and benefits discussed, monitors and equipment checked, pre-op evaluation and timeout performed  Epidural Patient position: sitting Prep: DuraPrep and site prepped and draped Patient monitoring: continuous pulse ox, blood pressure, heart rate and cardiac monitor Approach: midline Location: L3-L4 Injection technique: LOR air  Needle:  Needle type: Tuohy  Needle gauge: 17 G Needle length: 9 cm Needle insertion depth: 7 cm Catheter type: closed end flexible Catheter size: 19 Gauge Catheter at skin depth: 12 cm Test dose: negative  Assessment Sensory level: T8 Events: blood not aspirated, injection not painful, no injection resistance, no paresthesia and negative IV test  Additional Notes Patient identified. Risks/Benefits/Options discussed with patient including but not limited to bleeding, infection, nerve damage, paralysis, failed block, incomplete pain control, headache, blood pressure changes, nausea, vomiting, reactions to medication both or allergic, itching and postpartum back pain. Confirmed with bedside nurse the patient's most recent platelet count. Confirmed with patient that they are not currently taking any anticoagulation, have any bleeding history or any family history of bleeding disorders. Patient expressed understanding and wished to proceed. All questions were answered. Sterile technique was used throughout the entire procedure. Please see nursing notes for vital signs. Test dose was given through epidural catheter and negative prior to continuing to dose epidural or start infusion. Warning signs of high block given to the patient including shortness of breath, tingling/numbness in hands, complete motor block,  or any concerning symptoms with instructions to call for help. Patient was given instructions on fall risk and not to get out of bed. All questions and concerns addressed with instructions to call with any issues or inadequate analgesia.  Reason for block:procedure for pain

## 2020-10-09 NOTE — Progress Notes (Signed)
OB Progress Note  S: Patient resting comfortably with epidural.   O: BP 106/75 (BP Location: Right Arm)   Pulse (!) 107   Temp 98.1 F (36.7 C) (Oral)   Resp 18   Ht 5' 6.5" (1.689 m)   Wt 90.4 kg   LMP 01/04/2020 (Exact Date)   SpO2 100%   BMI 31.69 kg/m   FHT: 145bpm, moderate variablity, + accels, - decels Toco: not graphing well SVE: 6/90/-2  A/P: 35 y.o. [id number] @ [redacted]w[redacted]d admitted for normal labor.  FWB: Cat. I Labor course: Progressing naturally, s/p SROM at approximately 0630 Pain: Epidural GBS: Negative Anticipate SVD  Offered pitocin for augmentation of labor; however, patient declines at this time as she desires to progress naturally.  Steva Ready, DO

## 2020-10-09 NOTE — Lactation Note (Signed)
This note was copied from a baby's chart. LC talked with RN, [place] Williams, to see if this was a good time to help latch. Mom plans to pump and bottle feed. Infant grunting and staff also considering a NICU consult.   Mom will need to be set up with dEBP once on the floor.

## 2020-10-09 NOTE — Social Work (Signed)
MOB was referred for history of anxiety.   * Referral screened out by Clinical Social Worker because none of the following criteria appear to apply:  ~ History of anxiety/depression during this pregnancy, or of post-partum depression following prior delivery. No prenatal concerns noted. ~ Diagnosis of anxiety and/or depression within last 3 years. Per chart review, MOB diagnosed 2012 or earlier.  OR * MOB's symptoms currently being treated with medication and/or therapy.  Please contact the Clinical Social Worker if needs arise, by MOB request, or if MOB scores greater than 9/yes to question 10 on Edinburgh Postpartum Depression Screen.  Courtney Fritz, LCSWA Clinical Social Work [facility]  [phone]  

## 2020-10-09 NOTE — MAU Note (Signed)
PT SAYS UC STRONG SINCE 8PM PNC WITH EAGLE- DR COLE TODAY - 3 CM DENIES HSV GBS- NEG

## 2020-10-09 NOTE — MAU Provider Note (Signed)
Event Date/Time   First Provider Initiated Contact with Patient 10/09/20 0309       S: [name] is a 35 y.o. [phone] at [redacted]w[redacted]d  who presents to MAU today complaining contractions q 3 minutes since 8pm. She denies vaginal bleeding. She denies LOF. She reports normal fetal movement.    O: BP 121/79 (BP Location: Right Arm)   Pulse 93   Temp 98.4 F (36.9 C) (Oral)   Resp 18   Ht 5' 6.5" (1.689 m)   Wt 90.4 kg   LMP 01/04/2020 (Exact Date)   SpO2 100%   BMI 31.69 kg/m  GENERAL: Well-developed, well-nourished female in no acute distress.  HEAD: Normocephalic, atraumatic.  CHEST: Normal effort of breathing, regular heart rate ABDOMEN: Soft, nontender, gravid  Cervical exam:  Dilation: 4 Effacement (%): 80 Cervical Position: Posterior Station: -3 Presentation: Vertex Exam by:: Santiago Bur, RN   Fetal Monitoring: FHT:  Baseline 140 , moderate variability, accelerations present, no decelerations Contractions: q 2-4 mins Irregular    Interpretation:   Reactive, Category I  Assessment: Single IUP @ [redacted]w[redacted]d Active labor Uterine contractions in pregnancy Reactive Fetal Heart Rate Tracing  Plan: Admit to Labor and Delivery Routine orders Wants epidural  MD to follow   Aviva Signs, CNM 10/09/2020 4:09 AM

## 2020-10-09 NOTE — Anesthesia Preprocedure Evaluation (Signed)
Anesthesia Evaluation  Patient identified by MRN, date of birth, ID band Patient awake    Reviewed: Allergy & Precautions, NPO status , Patient's Chart, lab work & pertinent test results  Airway Mallampati: II  TM Distance: >3 FB Neck ROM: Full    Dental no notable dental hx.    Pulmonary neg pulmonary ROS,    Pulmonary exam normal breath sounds clear to auscultation       Cardiovascular negative cardio ROS Normal cardiovascular exam Rhythm:Regular Rate:Normal     Neuro/Psych PSYCHIATRIC DISORDERS Anxiety negative neurological ROS     GI/Hepatic negative GI ROS, Neg liver ROS,   Endo/Other  negative endocrine ROS  Renal/GU negative Renal ROS  negative genitourinary   Musculoskeletal negative musculoskeletal ROS (+)   Abdominal   Peds  Hematology negative hematology ROS (+)   Anesthesia Other Findings Presented in labor  Reproductive/Obstetrics (+) Pregnancy                             Anesthesia Physical Anesthesia Plan  ASA: 2  Anesthesia Plan: Epidural   Post-op Pain Management:    Induction:   PONV Risk Score and Plan: Treatment may vary due to age or medical condition  Airway Management Planned: Natural Airway  Additional Equipment:   Intra-op Plan:   Post-operative Plan:   Informed Consent: I have reviewed the patients History and Physical, chart, labs and discussed the procedure including the risks, benefits and alternatives for the proposed anesthesia with the patient or authorized representative who has indicated his/her understanding and acceptance.       Plan Discussed with: Anesthesiologist  Anesthesia Plan Comments: (Patient identified. Risks, benefits, options discussed with patient including but not limited to bleeding, infection, nerve damage, paralysis, failed block, incomplete pain control, headache, blood pressure changes, nausea, vomiting, reactions  to medication, itching, and post partum back pain. Confirmed with bedside nurse the patient's most recent platelet count. Confirmed with the patient that they are not taking any anticoagulation, have any bleeding history or any family history of bleeding disorders. Patient expressed understanding and wishes to proceed. All questions were answered. )        Anesthesia Quick Evaluation

## 2020-10-10 LAB — CBC
HCT: 25 % — ABNORMAL LOW (ref 36.0–46.0)
Hemoglobin: 7.2 g/dL — ABNORMAL LOW (ref 12.0–15.0)
MCH: 19.9 pg — ABNORMAL LOW (ref 26.0–34.0)
MCHC: 28.8 g/dL — ABNORMAL LOW (ref 30.0–36.0)
MCV: 69.1 fL — ABNORMAL LOW (ref 80.0–100.0)
Platelets: 264 10*3/uL (ref 150–400)
RBC: 3.62 MIL/uL — ABNORMAL LOW (ref 3.87–5.11)
RDW: 17.8 % — ABNORMAL HIGH (ref 11.5–15.5)
WBC: 14.2 10*3/uL — ABNORMAL HIGH (ref 4.0–10.5)
nRBC: 0.1 % (ref 0.0–0.2)

## 2020-10-10 LAB — TSH: TSH: 0.549 u[IU]/mL (ref 0.350–4.500)

## 2020-10-10 LAB — T4, FREE: Free T4: 1.16 ng/dL — ABNORMAL HIGH (ref 0.61–1.12)

## 2020-10-10 MED ORDER — SODIUM CHLORIDE 0.9 % IV SOLN
500.0000 mg | Freq: Once | INTRAVENOUS | Status: AC
  Administered 2020-10-10: 500 mg via INTRAVENOUS
  Filled 2020-10-10: qty 25

## 2020-10-10 MED ORDER — MAGNESIUM OXIDE -MG SUPPLEMENT 400 (240 MG) MG PO TABS
400.0000 mg | ORAL_TABLET | Freq: Every day | ORAL | Status: DC
  Administered 2020-10-10 – 2020-10-11 (×2): 400 mg via ORAL
  Filled 2020-10-10 (×2): qty 1

## 2020-10-10 MED ORDER — SODIUM CHLORIDE 0.9 % IV SOLN
500.0000 mg | Freq: Once | INTRAVENOUS | Status: DC
  Filled 2020-10-10: qty 25

## 2020-10-10 NOTE — Anesthesia Postprocedure Evaluation (Signed)
Anesthesia Post Note  Patient: Courtney Fritz  Procedure(s) Performed: AN AD HOC LABOR EPIDURAL     Patient location during evaluation: Mother Baby Anesthesia Type: Epidural Level of consciousness: awake and alert Pain management: pain level controlled Vital Signs Assessment: post-procedure vital signs reviewed and stable Respiratory status: spontaneous breathing, nonlabored ventilation and respiratory function stable Cardiovascular status: stable Postop Assessment: no headache, no backache and epidural receding Anesthetic complications: no   No notable events documented.  Last Vitals:  Vitals:   10/10/20 0028 10/10/20 0500  BP: (!) 102/57 96/65  Pulse: 82 75  Resp: 18 18  Temp: 36.6 C 36.8 C  SpO2: 100% 100%    Last Pain:  Vitals:   10/10/20 0717  TempSrc:   PainSc: 0-No pain   Pain Goal: Patients Stated Pain Goal: 0 (10/09/20 0730)                 [name] N

## 2020-10-10 NOTE — Progress Notes (Signed)
Subjective: Postpartum Day # 1 : DR Elmon Kirschner physician. S/P NSVD due to spontaneous labor. Patient up ad lib, denies syncope or dizziness. Reports consuming regular diet without issues and denies N/V. Patient reports 0 bowel movement + passing flatus.  Denies issues with urination and reports bleeding is "lighter."  Patient is bottle feeding and reports going well.  Desires undecided for postpartum contraception.  Pain is being appropriately managed with use of po meds.   1st laceration Feeding:  bottle Contraceptive plan:  undecided Baby female  Objective: Vital signs in last 24 hours: Patient Vitals for the past 24 hrs:  BP Temp Temp src Pulse Resp SpO2  10/10/20 0028 (!) 102/57 97.8 F (36.6 C) -- 82 18 100 %  10/09/20 2025 107/61 98 F (36.7 C) -- 79 18 100 %  10/09/20 1613 105/60 98 F (36.7 C) Oral 80 18 --  10/09/20 1500 (!) 114/55 98.4 F (36.9 C) Oral 75 20 100 %  10/09/20 1420 120/73 -- -- 90 -- --  10/09/20 1400 126/71 -- -- (!) 102 19 --  10/09/20 1345 123/72 -- -- 99 19 --  10/09/20 1330 118/79 -- -- (!) 132 20 --  10/09/20 1315 102/84 98.1 F (36.7 C) Oral (!) 111 20 --  10/09/20 1305 102/86 -- -- (!) 211 -- --  10/09/20 1230 131/82 -- -- (!) 110 20 --  10/09/20 1200 (!) 139/92 -- -- (!) 122 20 100 %  10/09/20 1130 114/71 -- -- (!) 127 19 100 %  10/09/20 1115 131/86 98.1 F (36.7 C) Oral (!) 116 20 100 %  10/09/20 1030 121/83 -- -- (!) 112 20 --  10/09/20 1000 116/71 -- -- 100 18 --  10/09/20 0930 108/68 98.4 F (36.9 C) Oral 87 18 --  10/09/20 0900 110/68 -- -- 94 17 --  10/09/20 0830 121/77 -- -- 94 17 --  10/09/20 0800 118/68 -- -- 85 18 --  10/09/20 0730 106/75 98.1 F (36.7 C) Oral (!) 107 -- 100 %  10/09/20 0701 111/68 -- -- 97 -- --  10/09/20 0631 103/62 98.4 F (36.9 C) Oral (!) 108 -- --  10/09/20 0621 117/79 -- -- (!) 116 -- --  10/09/20 0615 119/66 -- -- 97 -- 99 %  10/09/20 0611 90/70 -- -- (!) 131 -- --  10/09/20 0605 105/62 -- -- (!)  113 -- 100 %  10/09/20 0601 127/77 -- -- (!) 104 -- --  10/09/20 0556 109/81 -- -- (!) 105 -- --  10/09/20 0552 122/79 -- -- (!) 108 -- --     Physical Exam:  General: alert, cooperative, appears stated age, and no distress Mood/Affect: flat affect, content, fsatigued Lungs: clear to auscultation, no wheezes, rales or rhonchi, symmetric air entry.  Heart: normal rate, regular rhythm, normal S1, S2, no murmurs, rubs, clicks or gallops. Breast: breasts appear normal, no suspicious masses, no skin or nipple changes or axillary nodes. Abdomen:  + bowel sounds, soft, non-tender GU: perineum approximate, healing well. No signs of external hematomas.  Uterine Fundus: firm Lochia: appropriate Skin: Warm, Dry. DVT Evaluation: No evidence of DVT seen on physical exam. Negative Homan's sign. No cords or calf tenderness. No significant calf/ankle edema.  CBC Latest Ref Rng & Units 10/09/2020 09/01/2010 08/30/2010  WBC 4.0 - 10.5 K/uL 11.7(H) 4.7 4.8  Hemoglobin 12.0 - 15.0 g/dL 8.2(L) 10.1(L) 10.3(L)  Hematocrit 36.0 - 46.0 % 28.8(L) 30.4(L) 30.8(L)  Platelets 150 - 400 K/uL 363 199 202  No results found for this or any previous visit (from the past 24 hour(s)).   CBG (last 3)  No results for input(s): GLUCAP in the last 72 hours.   I/O last 3 completed shifts: In: -  Out: 600 [Urine:600]   Assessment Postpartum Day # 1 : S/P NSVD due to spontaneous labor. Pt stable. -1 involution. Bottle feeding. Hemodynamically stable. H/O hyperthyroidism, no meds, asymptomatic. H/O anxiety, mood stable. H/O anemia admitting hgb 8.2, with EBL of 300, Cytotec and TXA given right after delivery, asymptomatic.   Plan: Continue other mgmt as ordered H/O anxiety: monitor mood H/O hyperthyroidism: THS and free T4 pending this morning.  H/O Iron def anemia, ordered IV venofer transfusion today.  VTE prophylactics: Early ambulated as tolerates.  Pain control: Motrin/Tylenol PRN Education given regarding  options for contraception, including barrier methods, injectable contraception, IUD placement, oral contraceptives.  Plan for discharge tomorrow and Lactation consult  Dr. Normand Sloop to be updated on patient status  [facility] NP-C, CNM 10/10/2020, 4:35 AM

## 2020-10-11 MED ORDER — ACETAMINOPHEN 325 MG PO TABS: 650.0000 mg | ORAL_TABLET | ORAL | Status: AC | PRN

## 2020-10-11 MED ORDER — IBUPROFEN 600 MG PO TABS: 600.0000 mg | ORAL_TABLET | Freq: Four times a day (QID) | ORAL | 0 refills | Status: AC

## 2020-10-11 NOTE — Discharge Summary (Signed)
SVD OB Discharge Summary  Patient Name: Courtney Fritz DOB: [birth year] MRN: [id number]  Date of admission: 10/09/2020 Intrauterine pregnancy: [redacted]w[redacted]d   Admitting diagnosis: Normal labor [O80, Z37.9] Labor abnormal [O62.9] Secondary diagnosis: Anemia of pregnancy  Date of discharge: 10/11/2020    Discharge diagnosis: Term Pregnancy Delivered     Prenatal history: [id number]   EDC : 10/10/2020, by Last Menstrual Period  [facility]ren[facility] care at [facility]  Primary provider : Richardson Dopp Prenatal course complicated by Anemia of pregnancy  Prenatal Labs: ABO, Rh: --/--/O POS (09/16 0425) / Antibody: NEG (09/16 0425) Rubella: Immune (04/05 0000)  /  RPR: NON REACTIVE (09/16 0425)  HBsAg: Negative (04/05 0000)  HIV: Non-reactive (04/05 0000)  GBS: Negative/-- (08/22 0000)                                    Hospital course:  Onset of Labor With Vaginal Delivery      35 y.o. yo [id number] at [redacted]w[redacted]d was admitted in Latent Labor on 10/09/2020. Patient had an uncomplicated labor course as follows:  Membrane Rupture Time/Date: 6:26 AM ,10/09/2020   Delivery Method:Vaginal, Spontaneous  Episiotomy: None  Lacerations:  1st degree;Vaginal  Patient had an uncomplicated postpartum course.  She is ambulating, tolerating a regular diet, passing flatus, and urinating well. Patient is discharged home in stable condition on 10/11/20.  Newborn Data: Birth date:10/09/2020  Birth time:1:03 PM  Gender:Female  Living status:Living  Apgars:9 ,9  Weight:3425 g  Delivering PROVIDER: DAVIES, MELISSA                                                            Complications: None  Newborn Data: Live born female  Birth Weight: 7 lb 8.8 oz (3425 g) APGAR: 9, 9  Newborn Delivery   Birth date/time: 10/09/2020 13:03:00 Delivery type: Vaginal, Spontaneous      Baby Feeding: Bottle and Breast Disposition:home with mother  Post partum procedures: IV iron infusion. PO iron.   Labs: Lab Results  Component Value Date    WBC 14.2 (H) 10/10/2020   HGB 7.2 (L) 10/10/2020   HCT 25.0 (L) 10/10/2020   MCV 69.1 (L) 10/10/2020   PLT 264 10/10/2020   CMP Latest Ref Rng & Units 09/01/2010  Glucose 70 - 99 mg/dL 84  BUN 6 - 23 mg/dL 3(L)  Creatinine 0.08 - 1.10 mg/dL 6.76  Sodium 195 - 093 mEq/L 132(L)  Potassium 3.5 - 5.1 mEq/L 3.2(L)  Chloride 96 - 112 mEq/L 99  CO2 19 - 32 mEq/L 17(L)  Calcium 8.4 - 10.5 mg/dL 8.6    Physical Exam @ time of discharge:  Vitals:   10/10/20 0028 10/10/20 0500 10/10/20 2045 10/11/20 0542  BP: (!) 102/57 96/65 115/75 103/63  Pulse: 82 75 77 60  Resp: 18 18 18 18   Temp: 97.8 F (36.6 C) 98.2 F (36.8 C) 97.9 F (36.6 C) 97.6 F (36.4 C)  TempSrc:   Oral Oral  SpO2: 100% 100% 100% 100%  Weight:      Height:       general: alert, cooperative, and no distress lochia: appropriate uterine fundus: firm perineum: 1st degree well approximated incision: N/A extremities: DVT Evaluation: No evidence of  DVT seen on physical exam. Negative Homan's sign. No cords or calf tenderness. No significant calf/ankle edema.  Discharge instructions:  "Baby and Me Booklet" and [facility] Booklet Discharge Medications:  Allergies as of 10/11/2020   No Known Allergies      Medication List     STOP taking these medications    ALBUTEROL-IPRATROPIUM IN   potassium chloride 10 MEQ tablet Commonly known as: KLOR-CON   Vitamin D3 75 MCG (3000 UT) Tabs       TAKE these medications    acetaminophen 325 MG tablet Commonly known as: Tylenol Take 2 tablets (650 mg total) by mouth every 4 (four) hours as needed (for pain scale < 4). What changed:  when to take this reasons to take this   citalopram 20 MG tablet Commonly known as: CELEXA Take 20 mg by mouth daily.   ibuprofen 600 MG tablet Commonly known as: ADVIL Take 1 tablet (600 mg total) by mouth every 6 (six) hours.   prenatal multivitamin 90-600-400 MG-MCG-MCG tablet Take 1 tablet by mouth daily.       Diet:  routine diet Activity: Advance as tolerated. Pelvic rest x 6 weeks.  Follow up:6 weeks  Signed: Carollee Leitz MSN, CNM 10/11/2020, 11:06 AM

## 2020-10-13 ENCOUNTER — Encounter (HOSPITAL_COMMUNITY): Payer: Self-pay | Admitting: Obstetrics and Gynecology

## 2020-10-14 ENCOUNTER — Inpatient Hospital Stay (HOSPITAL_COMMUNITY)
Admission: AD | Admit: 2020-10-14 | Discharge: 2020-10-14 | Disposition: A | Discharge status: Home / Self Care | Payer: Managed Care, Other (non HMO) | Attending: Obstetrics and Gynecology | Admitting: Obstetrics and Gynecology

## 2020-10-14 ENCOUNTER — Encounter (HOSPITAL_COMMUNITY): Payer: Self-pay | Admitting: Obstetrics and Gynecology

## 2020-10-14 DIAGNOSIS — Z79899 Other long term (current) drug therapy: Secondary | ICD-10-CM | POA: Diagnosis not present

## 2020-10-14 DIAGNOSIS — O9973 Diseases of the skin and subcutaneous tissue complicating the puerperium: Secondary | ICD-10-CM

## 2020-10-14 DIAGNOSIS — L03114 Cellulitis of left upper limb: Secondary | ICD-10-CM | POA: Diagnosis not present

## 2020-10-14 DIAGNOSIS — O99893 Other specified diseases and conditions complicating puerperium: Secondary | ICD-10-CM | POA: Diagnosis not present

## 2020-10-14 MED ORDER — ASPIRIN EC 325 MG PO TBEC: 325.0000 mg | DELAYED_RELEASE_TABLET | Freq: Every day | ORAL | 0 refills | Status: AC

## 2020-10-14 MED ORDER — ACETAMINOPHEN 500 MG PO TABS
1000.0000 mg | ORAL_TABLET | Freq: Once | ORAL | Status: AC
  Administered 2020-10-14: 1000 mg via ORAL
  Filled 2020-10-14: qty 2

## 2020-10-14 MED ORDER — CYCLOBENZAPRINE HCL 5 MG PO TABS
10.0000 mg | ORAL_TABLET | Freq: Once | ORAL | Status: AC
  Administered 2020-10-14: 10 mg via ORAL
  Filled 2020-10-14: qty 2

## 2020-10-14 MED ORDER — CLINDAMYCIN HCL 300 MG PO CAPS: 300.0000 mg | ORAL_CAPSULE | Freq: Three times a day (TID) | ORAL | 0 refills | Status: AC

## 2020-10-14 NOTE — MAU Note (Signed)
Vag del 9/16. Having pain in left forearm, started on Mon 9/19. Area where IV was is red, hot and tender to touch.  Also c/o excruciating HA.  No relief with Ibuprofen. No fever

## 2020-10-14 NOTE — MAU Provider Note (Signed)
History     CSN: [id number]  Arrival date and time: 10/14/20 1642   Event Date/Time   First Provider Initiated Contact with Patient 10/14/20 1735      Chief Complaint  Patient presents with   Arm Pain   Headache   Courtney Fritz is a 35 y.o. year old [id number] female who is 5 days PP after SVD on 10/09/20  presents to MAU reporting LT swelling, redness and pain. She received IV Venofer on 10/10/20. She reports near the end of the Venofer infusion she started having burning, the RN flushed the IV line and the burning improved. She was d/c'd home on 10/11/20. She started having pain, swelling and redness of her LT forearm Monday 10/12/20. She thought it was normal, but the sx's have worsened. She also complains of an "excruciating" H/A that is not relieved by Ibuprofen; pain rated >10/10. She receives [facility] with [facility].   OB History     Gravida  4   Para  4   Term  4   Preterm      AB      Living  4      SAB      IAB      Ectopic      Multiple  0   Live Births  4           Past Medical History:  Diagnosis Date   Anxiety     History reviewed. No pertinent surgical history.  History reviewed. No pertinent family history.  Social History   Tobacco Use   Smoking status: Never   Smokeless tobacco: Never  Vaping Use   Vaping Use: Never used  Substance Use Topics   Alcohol use: No   Drug use: No    Allergies: No Known Allergies  Medications Prior to Admission  Medication Sig Dispense Refill Last Dose   ibuprofen (ADVIL) 600 MG tablet Take 1 tablet (600 mg total) by mouth every 6 (six) hours. 30 tablet 0 10/14/2020   Prenat w/o A-FE-DSS-Methfol-FA (PRENATAL MULTIVITAMIN) 90-600-400 MG-MCG-MCG tablet Take 1 tablet by mouth daily.     10/14/2020   acetaminophen (TYLENOL) 325 MG tablet Take 2 tablets (650 mg total) by mouth every 4 (four) hours as needed (for pain scale < 4).      citalopram (CELEXA) 20 MG tablet Take 20 mg by mouth daily.          Review of Systems  Constitutional: Negative.   HENT: Negative.    Eyes: Negative.   Respiratory: Negative.    Cardiovascular: Negative.   Gastrointestinal: Negative.   Endocrine: Negative.   Genitourinary:  Positive for vaginal bleeding (normal lochia).  Musculoskeletal: Negative.   Skin:  Positive for color change (of LT forearm).       Redness, pain and swelling of LT forearm  Allergic/Immunologic: Negative.   Neurological:  Positive for headaches.  Hematological: Negative.   Psychiatric/Behavioral: Negative.    Physical Exam   Blood pressure 123/81, pulse (!) 109, temperature 98.4 F (36.9 C), temperature source Oral, resp. rate 18, last menstrual period 01/04/2020, SpO2 99 %, not currently breastfeeding.  Physical Exam Vitals and nursing note reviewed.  Constitutional:      Appearance: Normal appearance. She is normal weight.  Cardiovascular:     Rate and Rhythm: Tachycardia present.  Pulmonary:     Effort: Pulmonary effort is normal.  Genitourinary:    Comments: Not indicated Musculoskeletal:        General: Normal  range of motion.  Skin:    General: Skin is warm and dry.     Findings: Erythema present.     Comments: See attached pics of LT forearm below   Neurological:     Mental Status: She is alert and oriented to person, place, and time.  Psychiatric:        Attention and Perception: Attention and perception normal.        Mood and Affect: Mood normal.        Speech: Speech normal.        Behavior: Behavior normal. Behavior is cooperative.        Thought Content: Thought content normal.        Cognition and Memory: Cognition and memory normal.        Judgment: Judgment normal.        *Reddened area measures 11 cm x 5 cm  MAU Course  Procedures  MDM Flexeril 10 mg po -- H/A improving 8/10 from >10/10 Tylenol 1000 mg po -- H/A improving 8/10 from >10/10   *Consult with Dr. Despina Hidden @ 1803 - notified of patient's complaints, & assessments,  recommended tx plan for cellulitis with Cleocin 600 mg TID x 7 days, Aspirin 325 mg x 2 weeks, applying warm compresses and soak in warm water  Assessment and Plan  Cellulitis of arm, left - Plan: Discharge patient  - Rx for Cleocin 600 mg TID x 7 days - Rx for Aspirin 325 mg daily x 14 days - Advised to apply warm compresses and soak LT arm in warm water - F/U with [facility] if condition worsens or not any better in 1 week  Courtney Fritz, CNM 10/14/2020, 5:35 PM

## 2020-10-14 NOTE — Discharge Instructions (Addendum)
Apply warm compress to left forearm and soak left forearm in warm water. Please call [facility] if your condition worsens or is not improved at the end of your antibiotic treatment.

## 2020-10-15 ENCOUNTER — Inpatient Hospital Stay (HOSPITAL_COMMUNITY): Payer: Managed Care, Other (non HMO)

## 2020-10-15 ENCOUNTER — Inpatient Hospital Stay (HOSPITAL_COMMUNITY)
Admission: RE | Admit: 2020-10-15 | Payer: Managed Care, Other (non HMO) | Source: Ambulatory Visit | Admitting: Obstetrics and Gynecology

## 2020-10-21 ENCOUNTER — Telehealth (HOSPITAL_COMMUNITY): Payer: Self-pay | Admitting: *Deleted

## 2020-10-21 NOTE — Telephone Encounter (Signed)
Hospital Discharge Follow-Up Call:  Patient reports that she is well and has no concerns about her healing process.  EPDS today was 3 and she endorses this accurately reflects that she is doing well emotionally.  Patient says that baby is well and she has no concerns about baby's health.  She says that baby sleeps in a bassinet.  Reviewed ABCs of Safe Sleep.

## 2021-01-03 NOTE — Progress Notes (Deleted)
  Subjective:    Courtney Fritz - 35 y.o. female MRN [id number]  Date of birth: 02-02-1985  HPI  Courtney Fritz is to establish care.  Recently had baby Sept 2022  Current issues and/or concerns:  ROS per HPI     Health Maintenance:  - *** Health Maintenance Due  Topic Date Due   Hepatitis C Screening  Never done   PAP SMEAR-Modifier  Never done   COVID-19 Vaccine (4 - Booster for Pfizer series) 07/22/2019   INFLUENZA VACCINE  08/24/2020     Past Medical History: Patient Active Problem List   Diagnosis Date Noted   Labor abnormal 10/11/2020   Normal labor 10/09/2020   SVD (spontaneous vaginal delivery) 10/09/2020   Normal postpartum course 10/09/2020      Social History   reports that she has never smoked. She has never used smokeless tobacco. She reports that she does not drink alcohol and does not use drugs.   Family History  family history is not on file.   Medications: reviewed and updated   Objective:   Physical Exam There were no vitals taken for this visit. Physical Exam      Assessment & Plan:         Patient was given clear instructions to go to Emergency Department or return to medical center if symptoms don't improve, worsen, or new problems develop.The patient verbalized understanding.  I discussed the assessment and treatment plan with the patient. The patient was provided an opportunity to ask questions and all were answered. The patient agreed with the plan and demonstrated an understanding of the instructions.   The patient was advised to call back or seek an in-person evaluation if the symptoms worsen or if the condition fails to improve as anticipated.    Ricky Stabs, NP 01/03/2021, 9:07 PM Primary Care at [facility]

## 2021-01-08 ENCOUNTER — Ambulatory Visit: Payer: Managed Care, Other (non HMO) | Admitting: Family

## 2021-01-19 NOTE — Progress Notes (Deleted)
°  Subjective:    Courtney Fritz - 35 y.o. female MRN [id number]  Date of birth: April 07, 1985  HPI  Courtney Fritz is to establish care and annual physical exam.    Current issues and/or concerns: NEED PAP  NEED FLU   ROS per HPI     Health Maintenance:   Health Maintenance Due  Topic Date Due   Hepatitis C Screening  Never done   PAP SMEAR-Modifier  Never done   COVID-19 Vaccine (4 - Booster for Pfizer series) 07/22/2019   INFLUENZA VACCINE  08/24/2020     Past Medical History: Patient Active Problem List   Diagnosis Date Noted   Labor abnormal 10/11/2020   Normal labor 10/09/2020   SVD (spontaneous vaginal delivery) 10/09/2020   Normal postpartum course 10/09/2020      Social History   reports that she has never smoked. She has never used smokeless tobacco. She reports that she does not drink alcohol and does not use drugs.   Family History  family history is not on file.   Medications: reviewed and updated   Objective:   Physical Exam There were no vitals taken for this visit. Physical Exam      Assessment & Plan:         Patient was given clear instructions to go to Emergency Department or return to medical center if symptoms don't improve, worsen, or new problems develop.The patient verbalized understanding.  I discussed the assessment and treatment plan with the patient. The patient was provided an opportunity to ask questions and all were answered. The patient agreed with the plan and demonstrated an understanding of the instructions.   The patient was advised to call back or seek an in-person evaluation if the symptoms worsen or if the condition fails to improve as anticipated.    Ricky Stabs, NP 01/19/2021, 7:43 AM [facility]

## 2021-01-26 ENCOUNTER — Ambulatory Visit: Payer: Managed Care, Other (non HMO) | Admitting: Family

## 2023-08-29 ENCOUNTER — Encounter
